# Patient Record
Sex: Male | Born: 1965 | Race: White | Hispanic: No | Marital: Single | Smoking: Never smoker
Health system: Southern US, Community
[De-identification: ages and names within clinical notes are randomized; demographics above are authoritative.]

## PROBLEM LIST (undated history)

## (undated) DIAGNOSIS — E781 Pure hyperglyceridemia: Secondary | ICD-10-CM

## (undated) DIAGNOSIS — I10 Essential (primary) hypertension: Secondary | ICD-10-CM

## (undated) HISTORY — DX: Essential (primary) hypertension: I10

## (undated) HISTORY — DX: Pure hyperglyceridemia: E78.1

## (undated) HISTORY — PX: MENISCUS REPAIR: SHX5179

## (undated) HISTORY — PX: KIDNEY STONE SURGERY: SHX686

---

## 2007-08-12 ENCOUNTER — Ambulatory Visit: Payer: Self-pay | Admitting: Family Medicine

## 2007-08-12 DIAGNOSIS — F419 Anxiety disorder, unspecified: Secondary | ICD-10-CM

## 2007-08-12 DIAGNOSIS — F329 Major depressive disorder, single episode, unspecified: Secondary | ICD-10-CM

## 2007-08-12 LAB — CONVERTED CEMR LAB
ALT: 33 U/L
AST: 25 U/L
Albumin: 4.7 g/dL
Alkaline Phosphatase: 70 U/L
BUN: 17 mg/dL
CO2: 26 meq/L
Calcium: 9.3 mg/dL
Chloride: 104 meq/L
Cholesterol: 144 mg/dL
Creatinine, Ser: 0.83 mg/dL
Glucose, Bld: 92 mg/dL
HDL: 33 mg/dL — ABNORMAL LOW
LDL Cholesterol: 74 mg/dL
Potassium: 4.5 meq/L
Sodium: 141 meq/L
Total Bilirubin: 2.2 mg/dL — ABNORMAL HIGH
Total CHOL/HDL Ratio: 4.4
Total Protein: 7.4 g/dL
Triglycerides: 186 mg/dL — ABNORMAL HIGH
VLDL: 37 mg/dL

## 2007-08-13 ENCOUNTER — Encounter: Payer: Self-pay | Admitting: Family Medicine

## 2010-11-09 ENCOUNTER — Ambulatory Visit (HOSPITAL_COMMUNITY)
Admission: RE | Admit: 2010-11-09 | Discharge: 2010-11-09 | Disposition: A | Discharge status: Home / Self Care | Payer: Self-pay | Source: Ambulatory Visit | Attending: Orthopedic Surgery | Admitting: Orthopedic Surgery

## 2010-11-09 ENCOUNTER — Other Ambulatory Visit (HOSPITAL_COMMUNITY): Payer: Self-pay | Admitting: Orthopedic Surgery

## 2010-11-09 DIAGNOSIS — Z77018 Contact with and (suspected) exposure to other hazardous metals: Secondary | ICD-10-CM

## 2010-11-09 DIAGNOSIS — Z0389 Encounter for observation for other suspected diseases and conditions ruled out: Secondary | ICD-10-CM | POA: Insufficient documentation

## 2010-11-21 ENCOUNTER — Encounter (HOSPITAL_BASED_OUTPATIENT_CLINIC_OR_DEPARTMENT_OTHER)
Admission: RE | Admit: 2010-11-21 | Discharge: 2010-11-21 | Disposition: A | Discharge status: Home / Self Care | Payer: Self-pay | Source: Ambulatory Visit | Attending: Orthopedic Surgery | Admitting: Orthopedic Surgery

## 2010-11-21 DIAGNOSIS — IMO0002 Reserved for concepts with insufficient information to code with codable children: Secondary | ICD-10-CM | POA: Insufficient documentation

## 2010-11-21 DIAGNOSIS — X58XXXA Exposure to other specified factors, initial encounter: Secondary | ICD-10-CM | POA: Insufficient documentation

## 2010-11-21 DIAGNOSIS — Z01810 Encounter for preprocedural cardiovascular examination: Secondary | ICD-10-CM | POA: Insufficient documentation

## 2010-11-23 ENCOUNTER — Ambulatory Visit (HOSPITAL_BASED_OUTPATIENT_CLINIC_OR_DEPARTMENT_OTHER)
Admission: RE | Admit: 2010-11-23 | Discharge: 2010-11-23 | Disposition: A | Discharge status: Home / Self Care | Payer: Self-pay | Source: Ambulatory Visit | Attending: Orthopedic Surgery | Admitting: Orthopedic Surgery

## 2010-11-23 DIAGNOSIS — M2240 Chondromalacia patellae, unspecified knee: Secondary | ICD-10-CM | POA: Insufficient documentation

## 2010-11-23 DIAGNOSIS — Z01810 Encounter for preprocedural cardiovascular examination: Secondary | ICD-10-CM | POA: Insufficient documentation

## 2010-11-23 DIAGNOSIS — Z01812 Encounter for preprocedural laboratory examination: Secondary | ICD-10-CM | POA: Insufficient documentation

## 2010-11-23 DIAGNOSIS — M6740 Ganglion, unspecified site: Secondary | ICD-10-CM | POA: Insufficient documentation

## 2010-11-23 DIAGNOSIS — M23329 Other meniscus derangements, posterior horn of medial meniscus, unspecified knee: Secondary | ICD-10-CM | POA: Insufficient documentation

## 2010-11-26 NOTE — Op Note (Signed)
Benjamin Rios, Benjamin Rios                ACCOUNT NO.:  [id number]  MEDICAL RECORD NO.:  [id number]           PATIENT TYPE:  LOCATION:                                 FACILITY:  PHYSICIAN:  Almedia Balls. Ranell Patrick, M.D. DATE OF BIRTH:  [birth year]  DATE OF PROCEDURE:  11/23/2010 DATE OF DISCHARGE:                              OPERATIVE REPORT   PREOPERATIVE DIAGNOSIS:  Right knee medial meniscus tear as well as ganglion of cruciates.  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  PROCEDURE PERFORMED:  Right knee arthroscopy, partial medial meniscectomy, and decompression of the ganglion of the cruciate.  SURGEON:  Almedia Balls. Ranell Patrick, MD  ASSISTANT:  None.  LMA general anesthesia was used.  Estimated blood loss was minimal.  FLUID REPLACEMENT:  1000 mL crystalloid.  INSTRUMENT COUNTS:  Correct.  COMPLICATIONS:  None.  Perioperative antibiotics were given.  INDICATIONS:  The patient is a 45 year old male with worsening right knee pain.  The patient had preoperative conservative treatment.  He has also had a thorough workup revealing a torn medial meniscus and a ganglion cyst of the cruciates.  He has failed all measures of conservative management and desires operative treatment to restore function and eliminate pain.  Informed consent was obtained.  DESCRIPTION OF THE PROCEDURE:  After an adequate level of anesthesia was achieved, the patient was positioned in the supine position.  The right leg was correctly identified and placed in arthroscopic leg holder with left leg placed in a well-leg holder after sterile prep and drape of the right knee.  We entered the knee using standard portals including superolateral outflow, anterolateral scope, and anteromedial working portals.  We identified pretty significant synovitis present within the knee joint.  There was a deep chondral  fissure in the patella but no significant unstable cartilage, we just documented that patellar fissure.  That did appear to be all the way down to bone but it was only less than a millimeter wide and was not unstable.  We inspected medial and lateral gutters.  No loose bodies were noted, no significant medial plica was noted.  We entered the medial compartment, there was a complex posterior horn medial meniscus tear that was not amenable to repair.  We debrided that using basket forceps and motorized shaver.  We then went ahead and inspected the medial femoral condyle and tibial condyle. Articular cartilage was palpated and visualized and felt to be normal. The ACL was identified.  We went ahead and took our shaver and just pushed that past the ACL between the ACL and PCL posteriorly, immediately got some darker-appearing synovial fluid out of that consistent with a ganglion cyst decompression.  PCL was inspected and felt to be intact.  The lateral compartment was entered, no meniscal tear noted but there was some chondromalacia grade 3 on the femur and little bit on the tibial side.  We did not have to perform a chondroplasty on that, all of the cartilage was stable, just there was some erosion present there on that  side.  We inspected posterior aspect of the knee medially and laterally.  No issues posteriorly and no visible cyst on the back of the knee.  Following this, we did a final inspection of the entire knee.  No loose meniscal debridement pieces were visualized.  We then went and concluded surgery suturing the wound with 4-0 Monocryl followed by Steri-Strips and sterile compressive bandage.  A 0.25% Marcaine with epinephrine was injected into the knee after Steri-Strips applied.  The knee cycled to circulate that fluid around.  Cryotherapy unit placed, and the patient was taken to recovery room indication.     Almedia Balls. Ranell Patrick, M.D.     SRN/MEDQ  D:  11/23/2010  T:   11/24/2010  Job:  [id number]  Electronically Signed by Malon Kindle  on 11/26/2010 07:04:07 PM

## 2012-10-14 IMAGING — CR DG ORBITS FOR FOREIGN BODY
2 series · 2 of 2 positions shown · non-contrast
Comparison: None.

CLINICAL DATA: Welding history.  Pre MRI.

ORBITS FOR FOREIGN BODY - 2 VIEW

[w waters (1 of 2)]
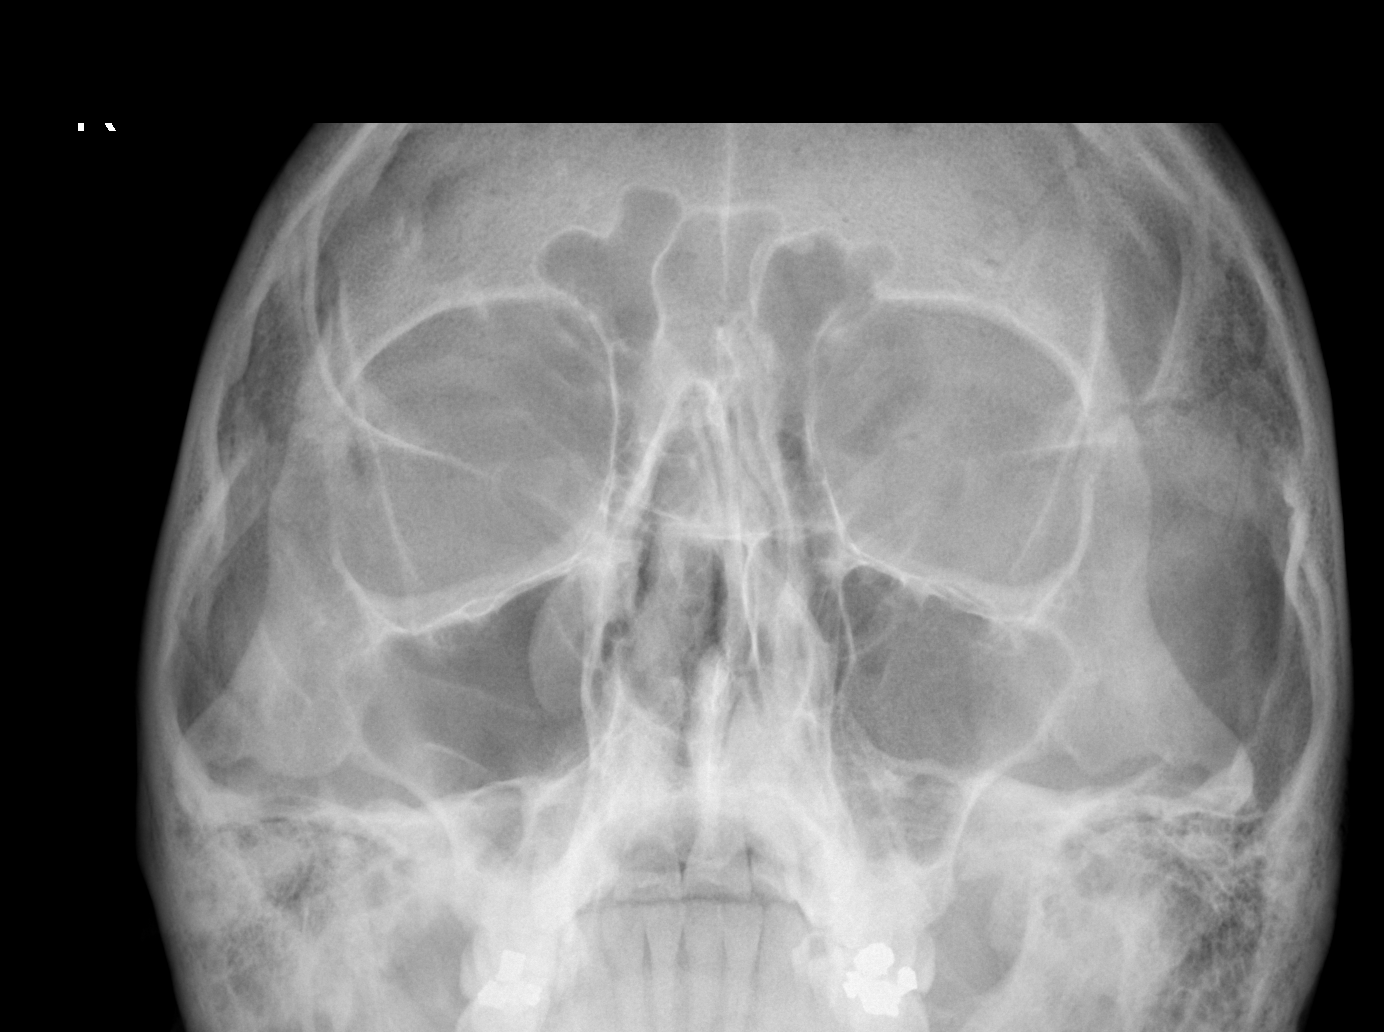

[w waters (2 of 2)]
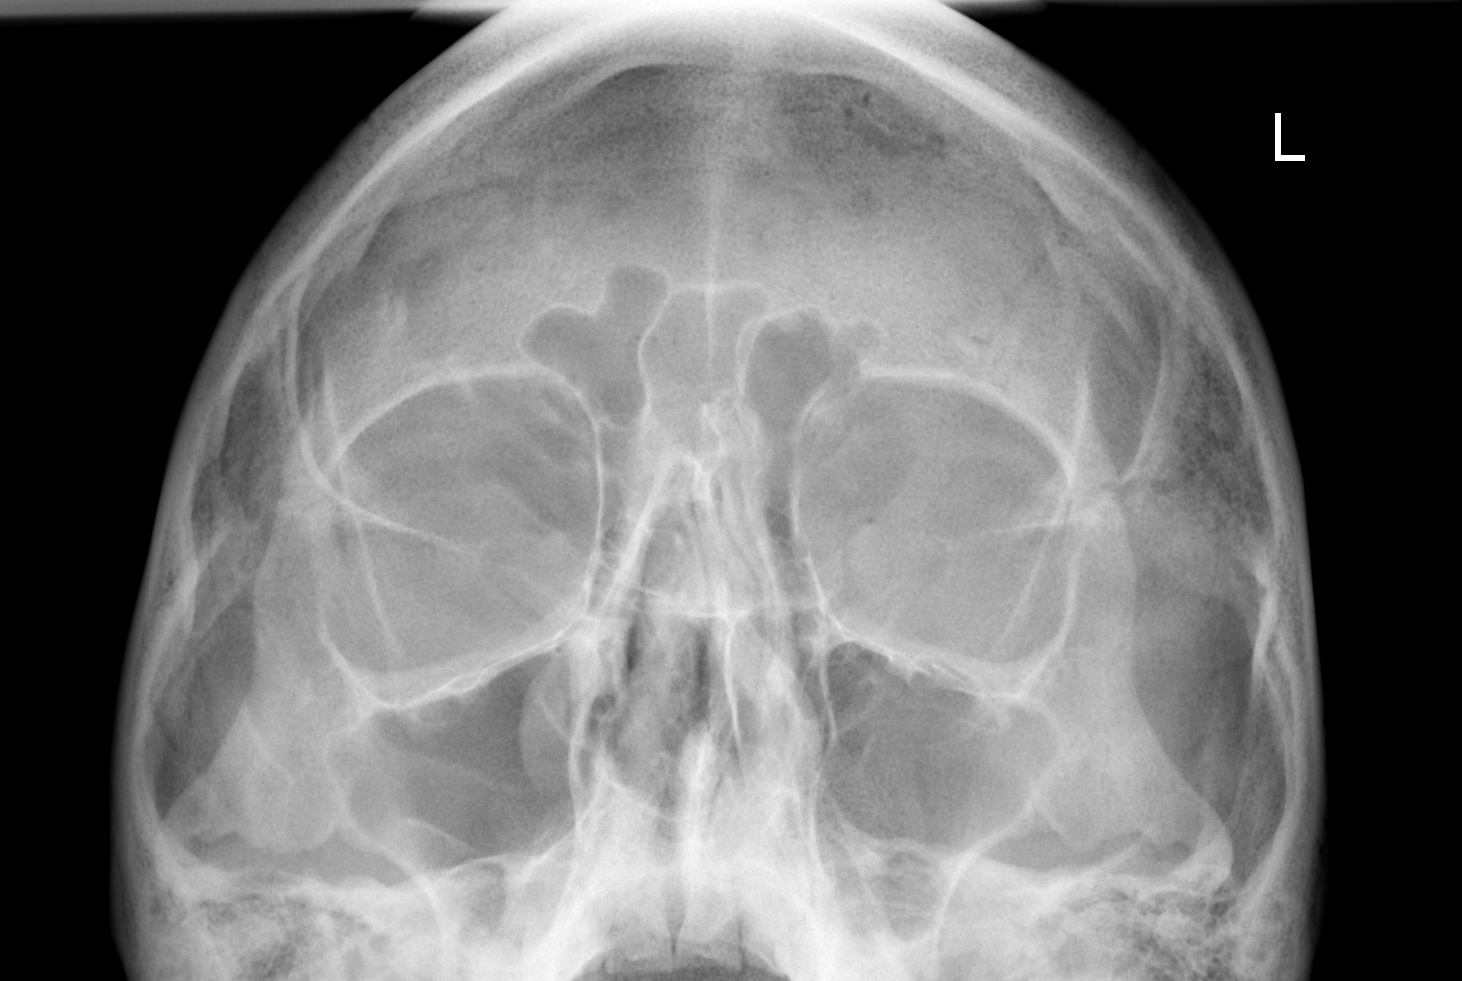

[2 of 2 positions shown; findings below may reference images not displayed]

FINDINGS: No orbital metallic foreign body.
IMPRESSION: No orbital metallic foreign body.

## 2017-10-20 ENCOUNTER — Encounter: Payer: Self-pay | Admitting: Emergency Medicine

## 2017-10-20 ENCOUNTER — Other Ambulatory Visit: Payer: Self-pay

## 2017-10-20 ENCOUNTER — Emergency Department
Admission: EM | Admit: 2017-10-20 | Discharge: 2017-10-20 | Disposition: A | Discharge status: Home / Self Care | Payer: Self-pay | Source: Home / Self Care | Attending: Emergency Medicine | Admitting: Emergency Medicine

## 2017-10-20 DIAGNOSIS — R002 Palpitations: Secondary | ICD-10-CM

## 2017-10-20 DIAGNOSIS — F419 Anxiety disorder, unspecified: Secondary | ICD-10-CM

## 2017-10-20 MED ORDER — ALPRAZOLAM 0.25 MG PO TABS: ORAL_TABLET | ORAL | 0 refills | Status: DC

## 2017-10-20 NOTE — Discharge Instructions (Addendum)
Today, your EKG was normal. Blood pressure 164/80.  The 164 is mildly high but might normalize when anxiety improves.  The 80-the lower number, is within normal limits.  Your physical exam was otherwise within normal limits. Today, I prescribed small amount of short-term anxiety medicine, Xanax, take 1 every 12 hours as needed. It is vitally important that you establish and follow-up with a family doctor within 5 days, for ongoing care and for medical concerns and for preventive care, and you risk severe health problems if you do not.-Please see the pamphlet with names of family doctors.-Your family doctor will need to do blood tests and other evaluation to rule out other medical causes for your symptoms Do not drink alcohol or use marijuana. If you have any severe symptoms, call 911 and go to hospital emergency room immediately

## 2017-10-20 NOTE — ED Triage Notes (Signed)
Pt c/o intermittent chest pain and heart racing over the last few weeks. States he has not seen a doctor in about 20 years.

## 2017-10-20 NOTE — ED Provider Notes (Signed)
[facility]    CSN: [id number] Arrival date & time: 10/20/17  1650 Patient presents to [facility] Saturday, 10/20/17 at 5:00 PM. This is his first visit to our facility   History   Chief Complaint Chief Complaint  Patient presents with  . Hypertension    HPI Benjamin Rios is a 52 y.o. male.  Patient provides a long circuitous history, and the following is a detailed summary of that history: HPI Chief complaint is a feeling of anxiety and occasional fast heartbeat and palpitations, but not currently...and an elevated BP reading today.  He denies actually having chest pain.  No exertional chest pain.  Occasionally feels air hunger at rest and needs to take a deep breath and feels anxious and then when he takes a deep breath, the feeling of air hunger resolves and anxiety improved somewhat.  After further questioning, he states that he has had feelings of anxiety for many many years, but denies depression or hallucinations or suicidal or homicidal ideation. He denies smoking cigarettes but states he is exposed to secondhand smoke. After further questioning, he admits to daily marijuana use "but only a small amount every day".  He states that he just decided to quit smoking marijuana and stopped smoking marijuana 3 days ago.  He states he is having more feelings of anxiety and palpitations since he decided to completely stop smoking marijuana 3 days ago. I questioned him about alcohol usage.  He states that he occasionally drinks alcohol to excess but not daily.  He states last time he drank was "last weekend", about 7 days ago, and he denies any alcohol in the past 7 days. He states he has no PCP, and that the last time he saw a regular doctor was about 15 years ago, and had slightly elevated triglycerides and BP and he did not take any medication and he never followed up with any PCP or other regular doctor since then. He he states that he was feeling a little more  anxious today, he states he went to the fire station today where his diastolic BP was 100, he states was advised by a firefighter to see a doctor ASAP.   History reviewed. No pertinent past medical history. He states he has had some isolated elevated BP readings over the years, but he does not think he has ever been diagnosed with hypertension.  No history of heart disease or chronic lung disease. It was noted in an old chart and EMR, diagnosis of "anxiety state, unspecified" on 08/12/2007 Patient Active Problem List   Diagnosis Date Noted  . ANXIETY STATE, UNSPECIFIED 08/12/2007    History reviewed. No pertinent surgical history.     Home Medications    Prior to Admission medications   Medication Sig Start Date End Date Taking? Authorizing Provider  ALPRAZolam Prudy Feeler) 0.25 MG tablet Take 1 every 12 hours as needed for anxiety or palpitations 10/20/17   Lajean Manes, MD    Family History History reviewed. No pertinent family history. Father had hypertension and COPD.  Mother hypertension Social History Social History   Tobacco Use  . Smoking status: Current Some Day Smoker  . Smokeless tobacco: Never Used  Substance Use Topics  . Alcohol use: Yes  . Drug use: Yes    Types: Marijuana     Allergies   Morphine and related   Review of Systems Review of Systems  Constitutional: Negative for fever.  HENT: Negative.  Negative for congestion and rhinorrhea.  Eyes: Negative for visual disturbance.  Respiratory: Negative for cough, chest tightness, shortness of breath (Denies current shortness of breath) and wheezing.   Cardiovascular: Positive for palpitations. Negative for chest pain and leg swelling.  Gastrointestinal: Negative for abdominal pain, diarrhea, nausea and vomiting.  Genitourinary: Negative.   Musculoskeletal: Negative.   Neurological: Negative for dizziness, tremors, seizures, syncope, speech difficulty, weakness and headaches.  Hematological: Negative for  adenopathy.  Psychiatric/Behavioral: Positive for sleep disturbance. The patient is nervous/anxious.        Denies hallucinations or suicidal or homicidal ideation     Physical Exam Triage Vital Signs ED Triage Vitals [10/20/17 1715]  Enc Vitals Group     BP (!) 164/80     Pulse Rate 70     Resp      Temp 98.6 F (37 C)     Temp Source Oral     SpO2 99 %     Weight 190 lb (86.2 kg)     Height      Head Circumference      Peak Flow      Pain Score 0     Pain Loc      Pain Edu?      Excl. in GC?    No data found.  Updated Vital Signs BP (!) 164/80 (BP Location: Right Arm)   Pulse 70   Temp 98.6 F (37 C) (Oral)   Wt 190 lb (86.2 kg)   SpO2 99%   Visual Acuity Right Eye Distance:   Left Eye Distance:   Bilateral Distance:    Right Eye Near:   Left Eye Near:    Bilateral Near:     Physical Exam  Constitutional: He is oriented to person, place, and time. He appears well-developed and well-nourished. No distress.  HENT:  Head: Normocephalic and atraumatic.  Mouth/Throat: Oropharynx is clear and moist.  Eyes: Pupils are equal, round, and reactive to light. No scleral icterus.  Neck: Normal range of motion. Neck supple. No JVD present. No tracheal deviation present. No thyromegaly present.  Cardiovascular: Normal rate, regular rhythm and normal heart sounds. Exam reveals no gallop and no friction rub.  No murmur heard. Pulmonary/Chest: Effort normal and breath sounds normal.  Abdominal: Soft. Bowel sounds are normal. He exhibits no distension and no mass. There is no tenderness. There is no guarding.  No hepatosplenomegaly  Musculoskeletal: He exhibits no edema.  Lymphadenopathy:    He has no cervical adenopathy.  Neurological: He is alert and oriented to person, place, and time. No cranial nerve deficit or sensory deficit. He exhibits normal muscle tone. Coordination normal.  No tremor.  Gait within normal limits  Skin: Skin is warm and dry. Capillary refill  takes less than 2 seconds. No rash noted. He is not diaphoretic.  Psychiatric: He has a normal mood and affect. His behavior is normal.  Vitals reviewed.    UC Treatments / Results  Labs (all labs ordered are listed, but only abnormal results are displayed) Labs Reviewed - No data to display  EKG None  EKG today within normal limits.  Normal sinus rhythm.  Ventricular rate 64.  PR interval 200.  QRS duration 98.  No ectopy.  No ST or T wave abnormality. Radiology No results found.  Procedures Procedures (including critical care time)  Medications Ordered in UC Medications - No data to display   Initial Impression / Assessment and Plan / UC Course  History of mildly elevated BP and history of  palpitations, however EKG today normal and BP systolic mildly elevated.  BP here 164/80. No evidence of acute cardiorespiratory event. His symptoms are likely anxiety.  It is possible there could be metabolic causes for his symptoms, and that would need to be evaluated by a comprehensive evaluation by his PCP.  Explained that to him and he voiced understanding.  Treatment options discussed, and after risk benefits alternatives discussed, I prescribed Xanax 0.25 mg.  #10, no refills.  1 twice daily for short-term treatment of anxiety symptoms.  He understands that we cannot refill this, and he needs to follow-up with his PCP and any other specialist his PCP decides upon, to further fully evaluate and treat his symptoms. He voiced understanding with above.   I have reviewed the triage vital signs and the nursing notes.  Pertinent labs & imaging results that were available during my care of the patient were reviewed by me and considered in my medical decision making (see chart for details).      An After Visit Summary was printed and given to the patient. "Today, your EKG was normal. Blood pressure 164/80.  The 164 is mildly high but might normalize when anxiety improves.  The 80-the lower  number, is within normal limits.  Your physical exam was otherwise within normal limits. Today, I prescribed small amount of short-term anxiety medicine, Xanax, take 1 every 12 hours as needed. It is vitally important that you establish and follow-up with a family doctor within 5 days, for ongoing care and for medical concerns and for preventive care, and you risk severe health problems if you do not.-Please see the pamphlet with names of family doctors.-Your family doctor will need to do blood tests and other evaluation to rule out other medical causes for your symptoms Do not drink alcohol or use marijuana. If you have any severe symptoms, call 911 and go to hospital emergency room immediately" Final Clinical Impressions(s) / UC Diagnoses   Final diagnoses:  Anxiety  Palpitations    ED Discharge Orders        Ordered    ALPRAZolam (XANAX) 0.25 MG tablet     10/20/17 1751       Controlled Substance Prescriptions [place] Controlled Substance Registry consulted? Yes, I have consulted the [place] Controlled Substances Registry for this patient, and feel the risk/benefit ratio today is favorable for proceeding with this prescription for a controlled substance.   Lajean ManesMassey, David, MD 10/20/17 [phone]

## 2017-10-22 ENCOUNTER — Emergency Department (INDEPENDENT_AMBULATORY_CARE_PROVIDER_SITE_OTHER)
Admission: EM | Admit: 2017-10-22 | Discharge: 2017-10-22 | Disposition: A | Discharge status: Home / Self Care | Payer: Self-pay | Source: Home / Self Care

## 2017-10-22 ENCOUNTER — Telehealth: Payer: Self-pay | Admitting: *Deleted

## 2017-10-22 DIAGNOSIS — I10 Essential (primary) hypertension: Secondary | ICD-10-CM

## 2017-10-22 LAB — POCT URINALYSIS DIP (MANUAL ENTRY)
Bilirubin, UA: NEGATIVE
GLUCOSE UA: NEGATIVE mg/dL
Ketones, POC UA: NEGATIVE mg/dL
Leukocytes, UA: NEGATIVE
Nitrite, UA: NEGATIVE
Protein Ur, POC: NEGATIVE mg/dL
RBC UA: NEGATIVE
SPEC GRAV UA: 1.025 (ref 1.010–1.025)
UROBILINOGEN UA: 0.2 U/dL
pH, UA: 5.5 (ref 5.0–8.0)

## 2017-10-22 NOTE — ED Notes (Signed)
Patient is here for blood draw only. Dr. Cathren HarshBeese ordered the labs, he will f[facility] at [facility] with Gena Fray[facility]ley Cummings. 1 Successful attempt in right AC.

## 2017-10-22 NOTE — Telephone Encounter (Signed)
Patient reports he was told to [facility] at [facility]. He thought he could go today but he cannot be seen today. He would like the labs drawn today. Per Dr. Cathren HarshBeese order CBC, CMP, TSH and UA. No need to see provider today. Patient is set up to potentially see Gena Frayharley Cummings on 10/24/17 1:20pm.

## 2017-10-23 LAB — CBC WITH DIFFERENTIAL/PLATELET
BASOS PCT: 0.5 %
Basophils Absolute: 31 cells/uL (ref 0–200)
EOS ABS: 87 {cells}/uL (ref 15–500)
Eosinophils Relative: 1.4 %
HCT: 48.9 % (ref 38.5–50.0)
HEMOGLOBIN: 17.5 g/dL — AB (ref 13.2–17.1)
Lymphs Abs: 2232 cells/uL (ref 850–3900)
MCH: 31.1 pg (ref 27.0–33.0)
MCHC: 35.8 g/dL (ref 32.0–36.0)
MCV: 86.9 fL (ref 80.0–100.0)
MONOS PCT: 11.3 %
MPV: 11.5 fL (ref 7.5–12.5)
NEUTROS ABS: 3150 {cells}/uL (ref 1500–7800)
Neutrophils Relative %: 50.8 %
Platelets: 191 10*3/uL (ref 140–400)
RBC: 5.63 10*6/uL (ref 4.20–5.80)
RDW: 12.5 % (ref 11.0–15.0)
Total Lymphocyte: 36 %
WBC mixed population: 701 cells/uL (ref 200–950)
WBC: 6.2 10*3/uL (ref 3.8–10.8)

## 2017-10-23 LAB — COMPREHENSIVE METABOLIC PANEL
AG Ratio: 1.8 (calc) (ref 1.0–2.5)
ALBUMIN MSPROF: 4.6 g/dL (ref 3.6–5.1)
ALT: 28 U/L (ref 9–46)
AST: 22 U/L (ref 10–35)
Alkaline phosphatase (APISO): 69 U/L (ref 40–115)
BUN: 24 mg/dL (ref 7–25)
CHLORIDE: 102 mmol/L (ref 98–110)
CO2: 23 mmol/L (ref 20–32)
CREATININE: 0.84 mg/dL (ref 0.70–1.33)
Calcium: 9.4 mg/dL (ref 8.6–10.3)
GLOBULIN: 2.6 g/dL (ref 1.9–3.7)
Glucose, Bld: 63 mg/dL — ABNORMAL LOW (ref 65–99)
SODIUM: 136 mmol/L (ref 135–146)
TOTAL PROTEIN: 7.2 g/dL (ref 6.1–8.1)
Total Bilirubin: 1.8 mg/dL — ABNORMAL HIGH (ref 0.2–1.2)

## 2017-10-23 LAB — SPECIMEN COMPROMISED

## 2017-10-23 LAB — TSH: TSH: 0.79 m[IU]/L (ref 0.40–4.50)

## 2017-10-24 ENCOUNTER — Encounter: Payer: Self-pay | Admitting: Physician Assistant

## 2017-10-24 ENCOUNTER — Ambulatory Visit (INDEPENDENT_AMBULATORY_CARE_PROVIDER_SITE_OTHER): Payer: Self-pay | Admitting: Physician Assistant

## 2017-10-24 VITALS — BP 156/78 | HR 79 | Ht 71.0 in | Wt 193.7 lb

## 2017-10-24 DIAGNOSIS — F1290 Cannabis use, unspecified, uncomplicated: Secondary | ICD-10-CM | POA: Insufficient documentation

## 2017-10-24 DIAGNOSIS — R61 Generalized hyperhidrosis: Secondary | ICD-10-CM

## 2017-10-24 DIAGNOSIS — Z13220 Encounter for screening for lipoid disorders: Secondary | ICD-10-CM

## 2017-10-24 DIAGNOSIS — Z7689 Persons encountering health services in other specified circumstances: Secondary | ICD-10-CM

## 2017-10-24 DIAGNOSIS — R6889 Other general symptoms and signs: Secondary | ICD-10-CM

## 2017-10-24 DIAGNOSIS — R002 Palpitations: Secondary | ICD-10-CM

## 2017-10-24 DIAGNOSIS — I10 Essential (primary) hypertension: Secondary | ICD-10-CM

## 2017-10-24 DIAGNOSIS — Z131 Encounter for screening for diabetes mellitus: Secondary | ICD-10-CM

## 2017-10-24 LAB — CHOLESTEROL, TOTAL: CHOLESTEROL: 160 mg/dL (ref <200)

## 2017-10-24 MED ORDER — ASPIRIN EC 81 MG PO TBEC: 81.0000 mg | DELAYED_RELEASE_TABLET | Freq: Every day | ORAL | 3 refills | Status: AC

## 2017-10-24 NOTE — Patient Instructions (Addendum)
For your blood pressure: - Goal <130/80 - baby aspirin 81 mg daily to help prevent heart attack/stroke - monitor and log blood pressures at home - check around the same time each day in a relaxed setting - Limit salt to <2000 mg/day - Follow DASH eating plan - limit alcohol to 2 standard drinks per day for men and 1 per day for women - avoid tobacco products - weight loss: 7% of current body weight - follow-up at least every 6 months for your blood pressure   For palpitations: - avoid all stimulants including caffeine and nicotine - avoid alcohol   Hypertension Hypertension, commonly called high blood pressure, is when the force of blood pumping through the arteries is too strong. The arteries are the blood vessels that carry blood from the heart throughout the body. Hypertension forces the heart to work harder to pump blood and may cause arteries to become narrow or stiff. Having untreated or uncontrolled hypertension can cause heart attacks, strokes, kidney disease, and other problems. A blood pressure reading consists of a higher number over a lower number. Ideally, your blood pressure should be below 120/80. The first ("top") number is called the systolic pressure. It is a measure of the pressure in your arteries as your heart beats. The second ("bottom") number is called the diastolic pressure. It is a measure of the pressure in your arteries as the heart relaxes. What are the causes? The cause of this condition is not known. What increases the risk? Some risk factors for high blood pressure are under your control. Others are not. Factors you can change  Smoking.  Having type 2 diabetes mellitus, high cholesterol, or both.  Not getting enough exercise or physical activity.  Being overweight.  Having too much fat, sugar, calories, or salt (sodium) in your diet.  Drinking too much alcohol. Factors that are difficult or impossible to change  Having chronic kidney  disease.  Having a family history of high blood pressure.  Age. Risk increases with age.  Race. You may be at higher risk if you are African-American.  Gender. Men are at higher risk than women before age 61. After age 34, women are at higher risk than men.  Having obstructive sleep apnea.  Stress. What are the signs or symptoms? Extremely high blood pressure (hypertensive crisis) may cause:  Headache.  Anxiety.  Shortness of breath.  Nosebleed.  Nausea and vomiting.  Severe chest pain.  Jerky movements you cannot control (seizures).  How is this diagnosed? This condition is diagnosed by measuring your blood pressure while you are seated, with your arm resting on a surface. The cuff of the blood pressure monitor will be placed directly against the skin of your upper arm at the level of your heart. It should be measured at least twice using the same arm. Certain conditions can cause a difference in blood pressure between your right and left arms. Certain factors can cause blood pressure readings to be lower or higher than normal (elevated) for a short period of time:  When your blood pressure is higher when you are in a health care provider's office than when you are at home, this is called white coat hypertension. Most people with this condition do not need medicines.  When your blood pressure is higher at home than when you are in a health care provider's office, this is called masked hypertension. Most people with this condition may need medicines to control blood pressure.  If you have a  high blood pressure reading during one visit or you have normal blood pressure with other risk factors:  You may be asked to return on a different day to have your blood pressure checked again.  You may be asked to monitor your blood pressure at home for 1 week or longer.  If you are diagnosed with hypertension, you may have other blood or imaging tests to help your health care provider  understand your overall risk for other conditions. How is this treated? This condition is treated by making healthy lifestyle changes, such as eating healthy foods, exercising more, and reducing your alcohol intake. Your health care provider may prescribe medicine if lifestyle changes are not enough to get your blood pressure under control, and if:  Your systolic blood pressure is above 130.  Your diastolic blood pressure is above 80.  Your personal target blood pressure may vary depending on your medical conditions, your age, and other factors. Follow these instructions at home: Eating and drinking  Eat a diet that is high in fiber and potassium, and low in sodium, added sugar, and fat. An example eating plan is called the DASH (Dietary Approaches to Stop Hypertension) diet. To eat this way: ? Eat plenty of fresh fruits and vegetables. Try to fill half of your plate at each meal with fruits and vegetables. ? Eat whole grains, such as whole wheat pasta, brown rice, or whole grain bread. Fill about one quarter of your plate with whole grains. ? Eat or drink low-fat dairy products, such as skim milk or low-fat yogurt. ? Avoid fatty cuts of meat, processed or cured meats, and poultry with skin. Fill about one quarter of your plate with lean proteins, such as fish, chicken without skin, beans, eggs, and tofu. ? Avoid premade and processed foods. These tend to be higher in sodium, added sugar, and fat.  Reduce your daily sodium intake. Most people with hypertension should eat less than 1,500 mg of sodium a day.  Limit alcohol intake to no more than 1 drink a day for nonpregnant women and 2 drinks a day for men. One drink equals 12 oz of beer, 5 oz of wine, or 1 oz of hard liquor. Lifestyle  Work with your health care provider to maintain a healthy body weight or to lose weight. Ask what an ideal weight is for you.  Get at least 30 minutes of exercise that causes your heart to beat faster  (aerobic exercise) most days of the week. Activities may include walking, swimming, or biking.  Include exercise to strengthen your muscles (resistance exercise), such as pilates or lifting weights, as part of your weekly exercise routine. Try to do these types of exercises for 30 minutes at least 3 days a week.  Do not use any products that contain nicotine or tobacco, such as cigarettes and e-cigarettes. If you need help quitting, ask your health care provider.  Monitor your blood pressure at home as told by your health care provider.  Keep all follow-up visits as told by your health care provider. This is important. Medicines  Take over-the-counter and prescription medicines only as told by your health care provider. Follow directions carefully. Blood pressure medicines must be taken as prescribed.  Do not skip doses of blood pressure medicine. Doing this puts you at risk for problems and can make the medicine less effective.  Ask your health care provider about side effects or reactions to medicines that you should watch for. Contact a health care provider  if:  You think you are having a reaction to a medicine you are taking.  You have headaches that keep coming back (recurring).  You feel dizzy.  You have swelling in your ankles.  You have trouble with your vision. Get help right away if:  You develop a severe headache or confusion.  You have unusual weakness or numbness.  You feel faint.  You have severe pain in your chest or abdomen.  You vomit repeatedly.  You have trouble breathing. Summary  Hypertension is when the force of blood pumping through your arteries is too strong. If this condition is not controlled, it may put you at risk for serious complications.  Your personal target blood pressure may vary depending on your medical conditions, your age, and other factors. For most people, a normal blood pressure is less than 120/80.  Hypertension is treated with  lifestyle changes, medicines, or a combination of both. Lifestyle changes include weight loss, eating a healthy, low-sodium diet, exercising more, and limiting alcohol. This information is not intended to replace advice given to you by your health care provider. Make sure you discuss any questions you have with your health care provider. Document Released: 06/26/2005 Document Revised: 05/24/2016 Document Reviewed: 05/24/2016 Elsevier Interactive Patient Education  2018 [employer].    Palpitations A palpitation is the feeling that your heartbeat is irregular or is faster than normal. It may feel like your heart is fluttering or skipping a beat. Palpitations are usually not a serious problem. They may be caused by many things, including smoking, caffeine, alcohol, stress, and certain medicines. Although most causes of palpitations are not serious, palpitations can be a sign of a serious medical problem. In some cases, you may need further medical evaluation. Follow these instructions at home: Pay attention to any changes in your symptoms. Take these actions to help with your condition:  Avoid the following: ? Caffeinated coffee, tea, soft drinks, diet pills, and energy drinks. ? Chocolate. ? Alcohol.  Do not use any tobacco products, such as cigarettes, chewing tobacco, and e-cigarettes. If you need help quitting, ask your health care provider.  Try to reduce your stress and anxiety. Things that can help you relax include: ? Yoga. ? Meditation. ? Physical activity, such as swimming, jogging, or walking. ? Biofeedback. This is a method that helps you learn to use your mind to control things in your body, such as your heartbeats.  Get plenty of rest and sleep.  Take over-the-counter and prescription medicines only as told by your health care provider.  Keep all follow-up visits as told by your health care provider. This is important.  Contact a health care provider if:  You continue  to have a fast or irregular heartbeat after 24 hours.  Your palpitations occur more often. Get help right away if:  You have chest pain or shortness of breath.  You have a severe headache.  You feel dizzy or you faint. This information is not intended to replace advice given to you by your health care provider. Make sure you discuss any questions you have with your health care provider. Document Released: 06/23/2000 Document Revised: 11/29/2015 Document Reviewed: 03/11/2015 Elsevier Interactive Patient Education  [employer].

## 2017-10-24 NOTE — Progress Notes (Signed)
HPI:                                                                Benjamin Rios is a 52 y.o. male who presents to [facility] Rios: Primary Care Sports Medicine today to establish care  Current concerns: review lab results, hypertension, sweating  Has been experiencing palpitations and decreased exercise tolerance that he describes as "pounding in my chest" for the last 1-2 months. Occurs multiple days per week lasting minutes. Most recently was provoked by weight lifting. 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 Associated with nausea, lightheadedness and dyspnea. Denies chest pain or abnormal beats. Drinks 1-2 cups of coffee per day. Denies stimulant or drug use. Family hx significant for heart disease in both parents. Denies vision change, headache, chest pain with exertion, orthopnea, lightheadedness, syncope and edema. Risk factors include: male sex, family hx  For the last 2.5 years he has had suffered from diaphoresis "all over" during the day as well as chronic nightsweats. States his skin always feels "clammy."   He was prescribed xanax by urgent care, but it is causing sedation. He does not feel he has an anxiety disorder. He smokes marijuana habitually in the evenings "for sleep" but reports he gave this up last week. Denies symptoms of mania/hypomania. Denies suicidal thinking. Denies auditory/visual hallucinations.  Depression screen PHQ 2/9 10/24/2017  Decreased Interest 2  Down, Depressed, Hopeless 0  PHQ - 2 Score 2  Altered sleeping 3  Tired, decreased energy 2  Change in appetite 0  Feeling bad or failure about yourself  0  Trouble concentrating 0  Moving slowly or fidgety/restless 0  Suicidal thoughts 0  PHQ-9 Score 7  Difficult doing work/chores Somewhat difficult    GAD 7 : Generalized Anxiety Score 10/24/2017  Nervous, Anxious, on Edge 1  Control/stop worrying 1  Worry too much - different things 1  Trouble relaxing  1  Restless 0  Easily annoyed or irritable 2  Afraid - awful might happen 0  Total GAD 7 Score 6  Anxiety Difficulty Somewhat difficult      Past Medical History:  Diagnosis Date  . Hypertension   . Hypertriglyceridemia    Past Surgical History:  Procedure Laterality Date  . KIDNEY STONE SURGERY    . MENISCUS REPAIR     Social History   Tobacco Use  . Smoking status: Never Smoker  . Smokeless tobacco: Never Used  Substance Use Topics  . Alcohol use: Yes   family history includes Atrial fibrillation in his mother; Diabetes in his father; Heart disease in his father; Hyperlipidemia in his father; Hypertension in his father and mother.    ROS: Review of Systems  Constitutional: Positive for diaphoresis.  Cardiovascular: Positive for palpitations.  Gastrointestinal: Positive for heartburn and nausea.  Psychiatric/Behavioral: The patient is nervous/anxious and has insomnia.      Medications: Current Outpatient Medications  Medication Sig Dispense Refill  . ALPRAZolam (XANAX) 0.25 MG tablet Take 1 every 12 hours as needed for anxiety or palpitations 10 tablet 0  . aspirin EC 81 MG tablet Take 1 tablet (81 mg total) by mouth daily. 90 tablet 3   No current facility-administered medications for this visit.    Allergies  Allergen Reactions  . Morphine And Related        Objective:  BP (!) 156/78   Pulse 79   Ht 5\' 11"  (1.803 m)   Wt 193 lb 11.2 oz (87.9 kg)   SpO2 96%   BMI 27.02 kg/m  Gen:  alert, not ill-appearing, no distress, appropriate for age HEENT: head normocephalic without obvious abnormality, conjunctiva and cornea clear, trachea midline Pulm: Normal work of breathing, normal phonation, clear to auscultation bilaterally, no wheezes, rales or rhonchi CV: Normal rate, regular rhythm, s1 and s2 distinct, no murmurs, clicks or rubs; radial pulses 2+ symmetric Neuro: alert and oriented x 3, no tremor MSK: extremities atraumatic, normal gait and  station Skin: warm, diaphoretic, no rashes on exposed skin, no jaundice, no cyanosis Psych: well-groomed, cooperative, good eye contact, appears anxious, speech is articulate, and thought processes clear and goal-directed    Results for orders placed or performed in visit on 10/24/17 (from the past 72 hour(s))  TSH + free T4     Status: None   Collection Time: 10/25/17  8:36 AM  Result Value Ref Range   TSH W/REFLEX TO FT4 1.07 0.40 - 4.50 mIU/L  Lipid Panel w/reflex Direct LDL     Status: Abnormal   Collection Time: 10/25/17  8:36 AM  Result Value Ref Range   Cholesterol 159 <200 mg/dL   HDL 35 (L) >16>40 mg/dL   Triglycerides 109243 (H) <150 mg/dL   LDL Cholesterol (Calc) 90 mg/dL (calc)    Comment: Reference range: <100 . Desirable range <100 mg/dL for primary prevention;   <70 mg/dL for patients with CHD or diabetic patients  with > or = 2 CHD risk factors. Marland Kitchen. LDL-C is now calculated using the Benjamin Rios-Hopkins  calculation, which is a validated novel method providing  better accuracy than the Friedewald equation in the  estimation of LDL-C.  Benjamin PollenMartin SS et al. Lenox AhrJAMA. 6045;409([id number];310(19): 2061-2068  (http://education.[facility].com/faq/FAQ164)    Total CHOL/HDL Ratio 4.5 <5.0 (calc)   Non-HDL Cholesterol (Calc) 124 <130 mg/dL (calc)    Comment: For patients with diabetes plus 1 major ASCVD risk  factor, treating to a non-HDL-C goal of <100 mg/dL  (LDL-C of <19<70 mg/dL) is considered a therapeutic  option.   Hemoglobin A1C w/out eAG     Status: None   Collection Time: 10/25/17  8:36 AM  Result Value Ref Range   Hgb A1c MFr Bld 5.1 <5.7 % of total Hgb    Comment: For the purpose of screening for the presence of diabetes: . <5.7%       Consistent with the absence of diabetes 5.7-6.4%    Consistent with increased risk for diabetes             (prediabetes) > or =6.5%  Consistent with diabetes . This assay result is consistent with a decreased risk of diabetes. . Currently, no  consensus exists regarding use of hemoglobin A1c for diagnosis of diabetes in children. . According to American Diabetes Association (ADA) guidelines, hemoglobin A1c <7.0% represents optimal control in non-pregnant diabetic patients. Different metrics may apply to specific patient populations.  Standards of Medical Care in Diabetes(ADA). .    No results found.    Assessment and Plan: 52 y.o. male with   1. Encounter to establish care - Personally reviewed PMH, PSH, PFH, medications, allergies, HM - Age-appropriate cancer screening: overdue for colon cancer screening, declines today - Influenza n/a - Tdap due - PHQ2=2   2. Uncontrolled hypertension BP Readings from  Last 3 Encounters:  10/24/17 (!) 156/78  10/22/17 (!) 142/83  10/20/17 (!) 164/80  - patient would like to "treat naturally." He is resistant to medication. Discussed risks and benefits of antihypertensive medication. He prefers to try aggressive lifestyle changes. Risks, benefits discussed. Patient to log Bps at home. Recommend baby asa for primary prevention.    3. Heart palpitations, Decreased exercise tolerance - given family hx of heart disease, uncontrolled HTN, and chest pain with exertion, recommend thorough outpatient cardiac work-up to include stress test and cardiac event monitor. Will do stress echo due to the decreased exercise tolerance to assess heart structure and EF - counseled to avoid all stimulants, alcohol and drugs. Avoid strenuous exercise until work-up is complete - CMP, CBC, TSH + free T4 - Comprehensive metabolic panel - ECHOCARDIOGRAM STRESS TEST; Future - Cardiac event monitor; Future  Encounter to establish care  Uncontrolled hypertension - Plan: aspirin EC 81 MG tablet  Heart palpitations - Plan: TSH + free T4, Comprehensive metabolic panel, ECHOCARDIOGRAM STRESS TEST, Cardiac event monitor  Decreased exercise tolerance - Plan: ECHOCARDIOGRAM STRESS TEST  Hyperhidrosis - Plan:  Hemoglobin A1c  Hyperbilirubinemia - Plan: Comprehensive metabolic panel  Screening for lipid disorders - Plan: Lipid Panel w/reflex Direct LDL  Screening for diabetes mellitus - Plan: Hemoglobin A1c    Patient education and anticipatory guidance given Patient agrees with treatment plan Follow-up as needed if symptoms worsen or fail to improve  Levonne Hubert PA-C

## 2017-10-25 ENCOUNTER — Other Ambulatory Visit: Payer: Self-pay | Admitting: Physician Assistant

## 2017-10-25 ENCOUNTER — Telehealth: Payer: Self-pay

## 2017-10-25 ENCOUNTER — Telehealth: Payer: Self-pay | Admitting: Physician Assistant

## 2017-10-25 ENCOUNTER — Encounter: Payer: Self-pay | Admitting: Physician Assistant

## 2017-10-25 LAB — COMPLETE METABOLIC PANEL WITH GFR
AG RATIO: 1.9 (calc) (ref 1.0–2.5)
ALT: 25 U/L (ref 9–46)
AST: 21 U/L (ref 10–35)
Albumin: 4.5 g/dL (ref 3.6–5.1)
Alkaline phosphatase (APISO): 66 U/L (ref 40–115)
BUN: 21 mg/dL (ref 7–25)
CO2: 28 mmol/L (ref 20–32)
CREATININE: 1 mg/dL (ref 0.70–1.33)
Calcium: 9.5 mg/dL (ref 8.6–10.3)
Chloride: 104 mmol/L (ref 98–110)
GFR, Est African American: 98 mL/min/{1.73_m2} (ref >=60)
GFR, Est Non African American: 85 mL/min/{1.73_m2} (ref >=60)
GLOBULIN: 2.4 g/dL (ref 1.9–3.7)
Glucose, Bld: 98 mg/dL (ref 65–99)
Potassium: 4.6 mmol/L (ref 3.5–5.3)
SODIUM: 139 mmol/L (ref 135–146)
TOTAL PROTEIN: 6.9 g/dL (ref 6.1–8.1)
Total Bilirubin: 2.4 mg/dL — ABNORMAL HIGH (ref 0.2–1.2)

## 2017-10-25 NOTE — Telephone Encounter (Signed)
Pt called clinic and stated he wants to know if PCP wanted him to start a BP Rx. If so, he is open to try. He reports a friend mentioned atenolol might help with his BP and anxiety.

## 2017-10-25 NOTE — Telephone Encounter (Signed)
Left VM with lab results and contact information.

## 2017-10-25 NOTE — Telephone Encounter (Signed)
Pt advised. Verbalized understanding.

## 2017-10-25 NOTE — Telephone Encounter (Signed)
I would like to wait for the results of his cardiac event monitor If we beta block him, it will alter the results of the test I would like him to continue with the original plan, which is monitor and record blood pressures at home and outpatient cardiac work-up Once I have this information, then I can start him on the right medication

## 2017-10-26 ENCOUNTER — Encounter: Payer: Self-pay | Admitting: Physician Assistant

## 2017-10-26 DIAGNOSIS — R61 Generalized hyperhidrosis: Secondary | ICD-10-CM | POA: Insufficient documentation

## 2017-10-26 LAB — LIPID PANEL W/REFLEX DIRECT LDL
CHOL/HDL RATIO: 4.5 (calc) (ref <5.0)
CHOLESTEROL: 159 mg/dL (ref <200)
HDL: 35 mg/dL — AB (ref >40)
LDL Cholesterol (Calc): 90 mg/dL (calc)
Non-HDL Cholesterol (Calc): 124 mg/dL (calc) (ref <130)
Triglycerides: 243 mg/dL — ABNORMAL HIGH (ref <150)

## 2017-10-26 LAB — TSH+FREE T4: TSH W/REFLEX TO FT4: 1.07 mIU/L (ref 0.40–4.50)

## 2017-10-26 LAB — HEMOGLOBIN A1C W/OUT EAG: Hgb A1c MFr Bld: 5.1 % of total Hgb (ref <5.7)

## 2017-10-29 NOTE — Progress Notes (Signed)
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 This is mild, does not generally cause symptoms and does not require treatment - no evidence of diabetes or thyroid disease - cholesterol is in a healthy range

## 2017-11-01 ENCOUNTER — Telehealth (HOSPITAL_COMMUNITY): Payer: Self-pay | Admitting: *Deleted

## 2017-11-01 NOTE — Telephone Encounter (Signed)
Patient given detailed instructions per Stress Test Requisition Sheet for test on 11/05/17 at 7:30.Patient Notified to arrive 30 minutes early, and that it is imperative to arrive on time for appointment to keep from having the test rescheduled.  Patient verbalized understanding. Benjamin DolinSharon S Brooks

## 2017-11-05 ENCOUNTER — Ambulatory Visit (HOSPITAL_COMMUNITY): Payer: Self-pay

## 2017-11-05 ENCOUNTER — Ambulatory Visit (HOSPITAL_COMMUNITY): Payer: Self-pay | Attending: Cardiovascular Disease

## 2017-11-05 DIAGNOSIS — R6889 Other general symptoms and signs: Secondary | ICD-10-CM

## 2017-11-05 DIAGNOSIS — E785 Hyperlipidemia, unspecified: Secondary | ICD-10-CM | POA: Insufficient documentation

## 2017-11-05 DIAGNOSIS — R002 Palpitations: Secondary | ICD-10-CM | POA: Insufficient documentation

## 2017-11-05 DIAGNOSIS — I10 Essential (primary) hypertension: Secondary | ICD-10-CM | POA: Insufficient documentation

## 2017-11-06 NOTE — Progress Notes (Signed)
Normal stress test No evidence of heart disease

## 2017-11-07 ENCOUNTER — Telehealth: Payer: Self-pay | Admitting: Physician Assistant

## 2017-11-07 NOTE — Telephone Encounter (Signed)
Called pt and advised him of your advice

## 2017-11-07 NOTE — Telephone Encounter (Signed)
Pt called and lvm stating his stress test came back normal and his bp has been spot on for the past month. He is wanting to know if he still needs to be taking the  of aspirin since the tests come back fine. Thanks

## 2017-11-07 NOTE — Telephone Encounter (Signed)
Even though his blood pressure is well-controlled, he still has hypertension, which is a risk factor for heart disease. Baby aspirin is recommended for men over 50 with cardiovascular risk factors as primary prevention for a heart attack. As with all medications, it is his choice if he would like to do this. But it is a preventive measure, not a treatment for blood pressure.

## 2017-11-12 ENCOUNTER — Ambulatory Visit (INDEPENDENT_AMBULATORY_CARE_PROVIDER_SITE_OTHER): Payer: Self-pay

## 2017-11-12 DIAGNOSIS — R002 Palpitations: Secondary | ICD-10-CM

## 2017-11-21 ENCOUNTER — Encounter: Payer: Self-pay | Admitting: Physician Assistant

## 2017-11-21 ENCOUNTER — Ambulatory Visit (INDEPENDENT_AMBULATORY_CARE_PROVIDER_SITE_OTHER): Payer: Self-pay | Admitting: Physician Assistant

## 2017-11-21 VITALS — BP 125/64 | HR 79 | Wt 188.0 lb

## 2017-11-21 DIAGNOSIS — R0600 Dyspnea, unspecified: Secondary | ICD-10-CM

## 2017-11-21 DIAGNOSIS — F32A Depression, unspecified: Secondary | ICD-10-CM

## 2017-11-21 DIAGNOSIS — G4719 Other hypersomnia: Secondary | ICD-10-CM

## 2017-11-21 DIAGNOSIS — R002 Palpitations: Secondary | ICD-10-CM

## 2017-11-21 DIAGNOSIS — Z9289 Personal history of other medical treatment: Secondary | ICD-10-CM

## 2017-11-21 DIAGNOSIS — R6882 Decreased libido: Secondary | ICD-10-CM

## 2017-11-21 DIAGNOSIS — I10 Essential (primary) hypertension: Secondary | ICD-10-CM

## 2017-11-21 DIAGNOSIS — R5382 Chronic fatigue, unspecified: Secondary | ICD-10-CM

## 2017-11-21 DIAGNOSIS — F329 Major depressive disorder, single episode, unspecified: Secondary | ICD-10-CM

## 2017-11-21 DIAGNOSIS — F419 Anxiety disorder, unspecified: Secondary | ICD-10-CM

## 2017-11-21 NOTE — Progress Notes (Signed)
HPI:                                                                Benjamin Rios is a 52 y.o. male who presents to [name]: Primary Care [facility] Medicine today for hypertension and palpitation follow-up  HTN: managing with diet and exercise. Checks BP's at home. BP range 109-144/69-82, approximately 2/3 of his readings are in range. Has made a lot of healthy changes; he has lost 6 pounds in 1 month, he is exercising most days. Denies vision change, headache, chest pain with exertion, orthopnea, lightheadedness, syncope and edema. Risk factors include: male sex, family hx He had a negative stress echo on 11/05/17 Currently wearing cardiac event monitor. Endorses palpitations with exercise and with stressful situations, this occurs a few days per week.  He is concerned about the possibility of sleep apnea. He states he is a light sleeper and at times is quite restless. There have been a few instances over the years of waking up feeling like he had stopped breathing. Denies witnessed apneas or disurptive snoring. He endorses excessive daytime sleepiness, decreased strength/endurance, depressed mood, falling asleep in the afternoons/evenings, and deterioration in work performance. Reports father has sleep apnea.   Depression screen [facility] 2/9 11/21/2017 10/24/2017  Decreased Interest 2 2  Down, Depressed, Hopeless 2 0  PHQ - 2 Score 4 2  Altered sleeping 3 3  Tired, decreased energy 2 2  Change in appetite 1 0  Feeling bad or failure about yourself  1 0  Trouble concentrating 1 0  Moving slowly or fidgety/restless 1 0  Suicidal thoughts 0 0  PHQ-9 Score 13 7  Difficult doing work/chores - Somewhat difficult    GAD 7 : Generalized Anxiety Score 11/21/2017 10/24/2017  Nervous, Anxious, on Edge 2 1  Control/stop worrying 2 1  Worry too much - different things 3 1  Trouble relaxing 2 1  Restless 1 0  Easily annoyed or irritable 3 2  Afraid - awful might happen 1 0  Total  GAD 7 Score 14 6  Anxiety Difficulty - Somewhat difficult      Past Medical History:  Diagnosis Date  . Hypertension   . Hypertriglyceridemia    Past Surgical History:  Procedure Laterality Date  . KIDNEY STONE SURGERY    . MENISCUS REPAIR     Social History   Tobacco Use  . Smoking status: Never Smoker  . Smokeless tobacco: Never Used  Substance Use Topics  . Alcohol use: Yes   family history includes Atrial fibrillation in his mother; Diabetes in his father; Heart disease in his father; Hyperlipidemia in his father; Hypertension in his father and mother.    ROS: Review of Systems  Constitutional: Positive for malaise/fatigue.  Psychiatric/Behavioral: Positive for depression.    Medications: Current Outpatient Medications  Medication Sig Dispense Refill  . aspirin EC 81 MG tablet Take 1 tablet (81 mg total) by mouth daily. 90 tablet 3   No current facility-administered medications for this visit.    Allergies  Allergen Reactions  . Morphine And Related        Objective:  BP 125/64   Pulse 79   Wt 188 lb (85.3 kg)   BMI 26.22 kg/m  Gen:  alert,  not ill-appearing, no distress, appropriate for age HEENT: head normocephalic without obvious abnormality, conjunctiva and cornea clear, wearing glasses, trachea midline Pulm: Normal work of breathing, normal phonation CV: wearing cardiac event monitor Neuro: alert and oriented x 3, no tremor MSK: extremities atraumatic, normal gait and station Skin: intact, no rashes on exposed skin, no jaundice, no cyanosis Psych: well-groomed, cooperative, good eye contact, euthymic mood, affect mood-congruent, speech is articulate, and thought processes clear and goal-directed    No results found for this or any previous visit (from the past 72 hour(s)). No results found.    Assessment and Plan: 52 y.o. male with   Hypertension goal BP (blood pressure) < 130/80  Gilbert syndrome  Low libido - ADAM Questionnaire  score 8 - Plan: Testosterone  Chronic fatigue - Plan: Testosterone  Excessive daytime sleepiness - Plan: Home sleep test  PND (paroxysmal nocturnal dyspnea) - Plan: Home sleep test  Anxiety and depression  Heart palpitations  History of exercise stress test - 11/05/17 negative  Hypertension BP Readings from Last 3 Encounters:  11/21/17 125/64  10/24/17 (!) 156/78  10/22/17 (!) 142/83  - well-controlled with diet and lifestyle measures - cont asa for primary prevention  Excessive daytime sleepiness - he has a positive ADAM questionairre. Checking fasting testosterone levels. Also recommend a sleep study - STOPBANG score 4  Anxiety and depression PHQ9=13, no acute safety issues GAD7=14, moderately severe Discussed non-medication treatments including CBT, mindfulness, meditation, sleep hygiene, exercise He may benefit from SSRI, but declines medication management at this tiime   Patient education and anticipatory guidance given Patient agrees with treatment plan Follow-up in 2 weeks for palpitations/review cardiac event monitor results or sooner as needed if symptoms worsen or fail to improve  Levonne Hubert PA-C

## 2017-11-21 NOTE — Patient Instructions (Addendum)
Return to the lab for 8 am fasting testosterone level   You will be contacted by [facility] about setting up a home sleep study. You can discuss cost with them   For your blood pressure: - Goal <130/80 - baby aspirin 81 mg daily to help prevent heart attack/stroke - monitor and log blood pressures at home - check around the same time each day in a relaxed setting - Limit salt to <2000 mg/day - Follow DASH eating plan - limit alcohol to 2 standard drinks per day for men and 1 per day for women - avoid tobacco products - weight loss: 7% of current body weight - follow-up every 6 months for your blood pressure   Physical Activity Recommendations for modifying lipids and lowering blood pressure Engage in aerobic physical activity to reduce LDL-cholesterol, non-HDL-cholesterol, and blood pressure  Frequency: 3-4 sessions per week  Intensity: moderate to vigorous  Duration: 40 minutes on average   1. Aerobic exercise  Frequency: 3-5 sessions per week  Intensity: 50-80% capacity  Duration: 20 - 60 minutes  Examples: walking, treadmill, cycling, rowing, stair climbing, and arm/leg ergometry  2. Resistance exercise  Frequency: 2-3 sessions per week  Intensity: 10-15 repetitions/set to moderate fatigue  Duration: 1-3 sets of 8-10 upper and lower body exercises  Examples: calisthenics, elastic bands, cuff/hand weights, dumbbels, free weights, wall pulleys, and weight machines  Heart-Healthy Lifestyle  Eating a diet rich in vegetables, fruits and whole grains: also includes low-fat dairy products, poultry, fish, legumes, and nuts; limit intake of sweets, sugar-sweetened beverages and red meats  Getting regular exercise  Maintaining a healthy weight  Not smoking or getting help quitting  Staying on top of your health; for some people, lifestyle changes alone may not be enough to prevent a heart attack or stroke. In these cases, taking a statin at the right dose will most  likely be necessary  Sleep Studies A sleep study (polysomnogram) is a series of tests done while you are sleeping. It can show how well you sleep. This can help your health care provider diagnose a sleep disorder and show how severe your sleep disorder is. A sleep study may lead to treatment that will help you sleep better and prevent other medical problems caused by poor sleep. If you have a sleep disorder, you may also be at risk for:  Sleep-related accidents.  High blood pressure.  Heart disease.  Stroke.  Other medical conditions.  Sleep disorders are common. Your health care provider may suspect a sleep disorder if you:  Have loud snoring most nights.  Have brief periods when you stop breathing at night.  Feel sleepy on most days.  Fall asleep suddenly during the day.  Have trouble falling asleep or staying asleep.  Feel like you need to move your legs when trying to fall asleep.  Have dreams that seem very real shortly after falling asleep.  Feel like you cannot move when you first wake up.  Which tests will I need to have? Most sleep studies last all night and include these tests:  Recordings of your brain activity.  Recordings of your eye movements.  Recording of your heart rate and rhythm.  Blood pressure readings.  Readings of the amount of oxygen in your blood.  Measurements of your chest and belly movement as you breathe during sleep.  If you have signs of the sleep disorder called sleep apnea during your test, you may get a mask to wear for the second half of  the night.  The mask provides continuous positive airway pressure (CPAP). This may improve sleep apnea significantly.  You will then have all tests done again with the mask in place to see if your measurements and recordings change.  How are sleep studies done? Most sleep studies are done over one full night of sleep.  You will arrive at the study center in the evening and can go home in the  morning.  Bring your pajamas and toothbrush.  Do not have caffeine on the day of your sleep study.  Your health care provider will let you know if you need to stop taking any of your regular medicines before the test.  To do the tests included in a polysomnogram, you will have:  Round, sticky patches with sensors attached to recording wires (electrodes) placed on your scalp, face, chest, and limbs.  Wires from all the electrodes and sensors run from your bed to a computer. The wires can be taken off and put back on if you need to get out of bed to go to the bathroom.  A sensor placed over your nose to measure airflow.  A finger clip put on one finger to measure your blood oxygen level.  A belt around your belly and a belt around your chest to measure breathing movements.  Where are sleep studies done? Sleep studies are done at sleep centers. A sleep center may be inside a hospital, office, or clinic. The room where you have the study may look like a hospital room or a hotel room. The health care providers doing the study may come in and out of the room during the study. Most of the time, they will be in another room monitoring your test. How is information from sleep studies helpful? A polysomnogram can be used along with your medical history and a physical exam to diagnose conditions, such as:  Sleep apnea.  Restless legs syndrome.  Sleep-related seizure disorders.  Sleep-related movement disorders.  A medical doctor who specializes in sleep will evaluate your sleep study. The specialist will share the results with your primary health care provider. Treatments based on your sleep study may include:  Improving your sleep habits (sleep hygiene).  Wearing a CPAP mask.  Wearing an oral device at night to improve breathing and reduce snoring.  Taking medicine for: ? Restless legs syndrome. ? Sleep-related seizure disorder. ? Sleep-related movement disorder.  This  information is not intended to replace advice given to you by your health care provider. Make sure you discuss any questions you have with your health care provider. Document Released: 12/31/2002 Document Revised: 02/20/2016 Document Reviewed: 09/01/2013 Elsevier Interactive Patient Education  [employer].

## 2017-11-23 LAB — TESTOSTERONE: Testosterone: 370 ng/dL (ref 250–827)

## 2017-11-23 NOTE — Progress Notes (Signed)
Testosterone level is normal. This is not the cause of his symptoms Recommend proceeding with sleep study

## 2017-12-10 ENCOUNTER — Telehealth: Payer: Self-pay | Admitting: *Deleted

## 2017-12-10 NOTE — Telephone Encounter (Signed)
Called to inform patient his last day of service on his cardiac event monitor will be 12/13/2017.  Patient instructed to ship monitor back directly to [facility] in prepaid packaging enclosed in kit.  Patient was appreciative of call. 

## 2017-12-17 NOTE — Progress Notes (Signed)
Cardiac event monitor did not show any heart arrhythmia During symptomatic episodes, patient's heart rate was normal or a little elevated at times Option to do a low-dose beta blocker for symptomatic management if patient would like

## 2019-05-12 ENCOUNTER — Other Ambulatory Visit: Payer: Self-pay

## 2019-05-12 ENCOUNTER — Encounter: Payer: Self-pay | Admitting: Emergency Medicine

## 2019-05-12 ENCOUNTER — Emergency Department
Admission: EM | Admit: 2019-05-12 | Discharge: 2019-05-12 | Disposition: A | Discharge status: Home / Self Care | Payer: Self-pay | Source: Home / Self Care | Attending: Family Medicine | Admitting: Family Medicine

## 2019-05-12 ENCOUNTER — Telehealth: Payer: Self-pay

## 2019-05-12 DIAGNOSIS — R1032 Left lower quadrant pain: Secondary | ICD-10-CM

## 2019-05-12 LAB — POCT CBC W AUTO DIFF (K'VILLE URGENT CARE)

## 2019-05-12 LAB — POCT URINALYSIS DIP (MANUAL ENTRY)
Bilirubin, UA: NEGATIVE
Blood, UA: NEGATIVE
Glucose, UA: NEGATIVE mg/dL
Leukocytes, UA: NEGATIVE
Nitrite, UA: NEGATIVE
Protein Ur, POC: NEGATIVE mg/dL
Spec Grav, UA: >=1.03 — AB (ref 1.010–1.025)
Urobilinogen, UA: 0.2 E.U./dL
pH, UA: 5.5 (ref 5.0–8.0)

## 2019-05-12 NOTE — Telephone Encounter (Signed)
Pt called and asked if he should be on antibiotics if he might have diverticulitis.  Could you please advise.

## 2019-05-12 NOTE — ED Provider Notes (Signed)
[facility]    CSN: [id number] Arrival date & time: 05/12/19  2035      History   Chief Complaint Chief Complaint  Patient presents with  . Abdominal Pain    HPI Benjamin Rios is a 53 y.o. male.   Three days ago patient awoke with left abdomen and flank pain that has persisted.  That evening he had fever to 99 for one night only.  His left flank/abdominal pain was tender to touch and worse with bending and twisting.  It was also worse when he was supine on his right side.  He denies nausea/vomiting, changes in bowel movements, and urinary symptoms.  He reports that his pain has been gradually improving but is still present. He has a past history of passing 6 kidney stones (last time approximately age 85).  He had a negative colonoscopy age 62. Family history of colon CA in maternal grandmother.  The history is provided by the patient.  Abdominal Pain Pain location:  LLQ and L flank Pain quality: aching and cramping   Pain radiates to:  Does not radiate Pain severity:  Moderate Onset quality:  Sudden Duration:  3 days Timing:  Constant Progression:  Improving Chronicity:  New Context: not diet changes, not eating, not previous surgeries, not recent illness, not recent travel, not sick contacts, not suspicious food intake and not trauma   Relieved by:  Nothing Worsened by:  Movement Ineffective treatments:  None tried Associated symptoms: fever   Associated symptoms: no anorexia, no belching, no chest pain, no chills, no constipation, no cough, no diarrhea, no dysuria, no fatigue, no flatus, no hematemesis, no hematochezia, no hematuria, no melena, no nausea, no shortness of breath, no sore throat and no vomiting   Risk factors: no alcohol abuse, has not had multiple surgeries and no NSAID use     Past Medical History:  Diagnosis Date  . Hypertension   . Hypertriglyceridemia     Patient Active Problem List   Diagnosis Date Noted  . Gilbert syndrome  11/21/2017  . Chronic fatigue 11/21/2017  . Low libido 11/21/2017  . Excessive daytime sleepiness 11/21/2017  . PND (paroxysmal nocturnal dyspnea) 11/21/2017  . History of exercise stress test 11/21/2017  . Hyperhidrosis 10/26/2017  . Hypertension goal BP (blood pressure) < 130/80 10/24/2017  . Heart palpitations 10/24/2017  . Decreased exercise tolerance 10/24/2017  . Hyperbilirubinemia 10/24/2017  . Marijuana use, continuous 10/24/2017  . Anxiety and depression 08/12/2007    Past Surgical History:  Procedure Laterality Date  . KIDNEY STONE SURGERY    . MENISCUS REPAIR         Home Medications    Prior to Admission medications   Medication Sig Start Date End Date Taking? Authorizing Provider  aspirin EC 81 MG tablet Take 1 tablet (81 mg total) by mouth daily. 10/24/17   Trixie Dredge, PA-C    Family History Family History  Problem Relation Age of Onset  . Hypertension Mother   . Atrial fibrillation Mother   . Heart disease Father   . Hypertension Father   . Diabetes Father   . Hyperlipidemia Father     Social History Social History   Tobacco Use  . Smoking status: Never Smoker  . Smokeless tobacco: Never Used  Substance Use Topics  . Alcohol use: Yes  . Drug use: Yes    Types: Marijuana     Allergies   Morphine and related   Review of Systems Review of Systems  Constitutional: Positive for fever. Negative for activity change, appetite change, chills, diaphoresis and fatigue.  HENT: Negative.  Negative for sore throat.   Eyes: Negative.   Respiratory: Negative for cough, chest tightness and shortness of breath.   Cardiovascular: Negative for chest pain.  Gastrointestinal: Positive for abdominal pain. Negative for anorexia, constipation, diarrhea, flatus, hematemesis, hematochezia, melena, nausea and vomiting.  Genitourinary: Positive for flank pain. Negative for decreased urine volume, discharge, dysuria, frequency, hematuria, scrotal  swelling, testicular pain and urgency.  Skin: Negative.   Neurological: Negative.      Physical Exam Triage Vital Signs ED Triage Vitals  Enc Vitals Group     BP 05/12/19 1022 (!) 138/94     Pulse Rate 05/12/19 1022 63     Resp --      Temp 05/12/19 1022 97.8 F (36.6 C)     Temp Source 05/12/19 1022 Oral     SpO2 05/12/19 1022 97 %     Weight 05/12/19 1019 194 lb (88 kg)     Height --      Head Circumference --      Peak Flow --      Pain Score 05/12/19 1020 4     Pain Loc --      Pain Edu? --      Excl. in GC? --    No data found.  Updated Vital Signs BP (!) 138/94 (BP Location: Right Arm)   Pulse 63   Temp 97.8 F (36.6 C) (Oral)   Wt 88 kg   SpO2 97%   BMI 27.06 kg/m   Visual Acuity Right Eye Distance:   Left Eye Distance:   Bilateral Distance:    Right Eye Near:   Left Eye Near:    Bilateral Near:     Physical Exam Vitals signs and nursing note reviewed.  Constitutional:      General: He is not in acute distress.    Appearance: He is normal weight.  HENT:     Head: Normocephalic.     Right Ear: External ear normal.     Left Ear: External ear normal.     Nose: Nose normal.     Mouth/Throat:     Mouth: Mucous membranes are moist.  Eyes:     Pupils: Pupils are equal, round, and reactive to light.  Cardiovascular:     Heart sounds: Normal heart sounds.  Pulmonary:     Breath sounds: Normal breath sounds.  Abdominal:     General: Abdomen is flat. Bowel sounds are normal. There is no distension.     Palpations: Abdomen is soft. There is no hepatomegaly, splenomegaly or mass.     Tenderness: There is abdominal tenderness in the left lower quadrant. There is guarding. There is no left CVA tenderness or rebound. Negative signs include Murphy's sign, McBurney's sign and obturator sign.     Hernia: No hernia is present.    Musculoskeletal:     Right lower leg: No edema.     Left lower leg: No edema.  Lymphadenopathy:     Cervical: No cervical  adenopathy.  Skin:    General: Skin is warm and dry.     Findings: No rash.  Neurological:     Mental Status: He is alert.      UC Treatments / Results  Labs (all labs ordered are listed, but only abnormal results are displayed) Labs Reviewed  POCT URINALYSIS DIP (MANUAL ENTRY) - Abnormal; Notable for the following components:  Result Value   Ketones, POC UA small (15) (*)    Spec Grav, UA >=1.030 (*)    All other components within normal limits  COMPLETE METABOLIC PANEL WITH GFR  AMYLASE  LIPASE  POCT CBC W AUTO DIFF ([facility]):  WBC 6.3; LY 26.9; MO 8.7; GR 64.4; Hgb 16.9; Platelets 177     EKG   Radiology No results found.  Procedures Procedures (including critical care time)  Medications Ordered in UC Medications - No data to display  Initial Impression / Assessment and Plan / UC Course  I have reviewed the triage vital signs and the nursing notes.  Pertinent labs & imaging results that were available during my care of the patient were reviewed by me and considered in my medical decision making (see chart for details).    Normal WBC and urinalysis reassuring.  No evidence urolithiasis. ?resolving diverticulitis.  Because of initial fever (resolved) and left lower quadrant abdominal pain improving, will treat patient conservatively with clear liquid diet initially followed by gradual advance in diet. CMP, amylase, lipase pending. Followup with Family Doctor if not improved in one week.    Advised to call if rash develops (would begin Valtrex).  Final Clinical Impressions(s) / UC Diagnoses   Final diagnoses:  Abdominal pain, left lower quadrant     Discharge Instructions     Begin clear liquids for 24 hours, then advance to a BRAT diet (Bananas, Rice, Applesauce, Toast) for 24 hours. Then gradually resume a regular diet when tolerated.   .  If symptoms become significantly worse during the night or over the weekend, proceed to the local  emergency room.     ED Prescriptions    None     -   Lattie Haw, MD 05/12/19 1735

## 2019-05-12 NOTE — Telephone Encounter (Signed)
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

## 2019-05-12 NOTE — ED Triage Notes (Signed)
Pt states Friday he woke up with abdominal pain on the left side only. States normal BM.Marland Kitchenpain is worse with movement. Tender to touch. No N+V, afebrile. Denies any abdominal surgeries in past. No urinary issues.

## 2019-05-12 NOTE — Discharge Instructions (Addendum)
Begin clear liquids for 24 hours, then advance to a [employer] (Bananas, Rice, Applesauce, Toast) for 24 hours. Then gradually resume a regular diet when tolerated.   .  If symptoms become significantly worse during the night or over the weekend, proceed to the local emergency room.

## 2019-05-13 LAB — COMPLETE METABOLIC PANEL WITH GFR
AG Ratio: 1.7 (calc) (ref 1.0–2.5)
ALT: 50 U/L — ABNORMAL HIGH (ref 9–46)
AST: 29 U/L (ref 10–35)
Albumin: 4.5 g/dL (ref 3.6–5.1)
Alkaline phosphatase (APISO): 83 U/L (ref 35–144)
BUN: 21 mg/dL (ref 7–25)
CO2: 23 mmol/L (ref 20–32)
Calcium: 9.5 mg/dL (ref 8.6–10.3)
Chloride: 103 mmol/L (ref 98–110)
Creat: 0.87 mg/dL (ref 0.70–1.33)
GFR, Est African American: 113 mL/min/{1.73_m2} (ref >=60)
GFR, Est Non African American: 97 mL/min/{1.73_m2} (ref >=60)
Globulin: 2.7 g/dL (calc) (ref 1.9–3.7)
Glucose, Bld: 85 mg/dL (ref 65–99)
Potassium: 4.1 mmol/L (ref 3.5–5.3)
Sodium: 138 mmol/L (ref 135–146)
Total Bilirubin: 2.3 mg/dL — ABNORMAL HIGH (ref 0.2–1.2)
Total Protein: 7.2 g/dL (ref 6.1–8.1)

## 2019-05-13 LAB — AMYLASE: Amylase: 32 U/L (ref 21–101)

## 2019-05-13 LAB — LIPASE: Lipase: 15 U/L (ref 7–60)

## 2019-05-15 ENCOUNTER — Telehealth: Payer: Self-pay | Admitting: Emergency Medicine

## 2019-05-15 NOTE — Telephone Encounter (Signed)
Discussed lab results with patient, all normal except for chronic elevated bilirubin and milidly elevated liver enzyme. Pt encouraged to follow up with PCP for recheck of labs. All questions answered.

## 2021-07-18 ENCOUNTER — Emergency Department
Admission: EM | Admit: 2021-07-18 | Discharge: 2021-07-18 | Disposition: A | Discharge status: Home / Self Care | Payer: Self-pay | Source: Home / Self Care

## 2021-07-18 ENCOUNTER — Other Ambulatory Visit: Payer: Self-pay

## 2021-07-18 DIAGNOSIS — U071 COVID-19: Secondary | ICD-10-CM

## 2021-07-18 LAB — POC SARS CORONAVIRUS 2 AG -  ED: SARS Coronavirus 2 Ag: POSITIVE — AB

## 2021-07-18 LAB — POCT INFLUENZA A/B
Influenza A, POC: NEGATIVE
Influenza B, POC: NEGATIVE

## 2021-07-18 MED ORDER — ONDANSETRON 4 MG PO TBDP
4.0000 mg | ORAL_TABLET | Freq: Once | ORAL | Status: AC
  Administered 2021-07-18: 4 mg via ORAL

## 2021-07-18 MED ORDER — MOLNUPIRAVIR EUA 200MG CAPSULE: 4.0000 | ORAL_CAPSULE | Freq: Two times a day (BID) | ORAL | 0 refills | Status: AC

## 2021-07-18 NOTE — ED Provider Notes (Signed)
[facility]    CSN: [id number] Arrival date & time: 07/18/21  1049      History   Chief Complaint Chief Complaint  Patient presents with   Cough    Cough, fever, body aches, and sore throat. X3 days    HPI Benjamin Rios is a 56 y.o. male.   HPI 56 year old male presents with cough, fever, body aches, sore throat for 3 days.  PMH significant for HTN, chronic fatigue, and Gilbert syndrome.  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   Chronic fatigue 11/21/2017   Low libido 11/21/2017   Excessive daytime sleepiness 11/21/2017   PND (paroxysmal nocturnal dyspnea) 11/21/2017   History of exercise stress test 11/21/2017   Hyperhidrosis 10/26/2017   Hypertension goal BP (blood pressure) < 130/80 10/24/2017   Heart palpitations 10/24/2017   Decreased exercise tolerance 10/24/2017   Hyperbilirubinemia 10/24/2017   Marijuana use, continuous 10/24/2017   Anxiety and depression 08/12/2007    Past Surgical History:  Procedure Laterality Date   KIDNEY STONE SURGERY     MENISCUS REPAIR         Home Medications    Prior to Admission medications   Medication Sig Start Date End Date Taking? Authorizing Provider  Acetaminophen (TYLENOL 8 HOUR PO) Take by mouth.   Yes [provider]  molnupiravir EUA (LAGEVRIO) 200 mg CAPS capsule Take 4 capsules (800 mg total) by mouth 2 (two) times daily for 5 days. 07/18/21 07/23/21 Yes Trevor Iha, FNP  aspirin EC 81 MG tablet Take 1 tablet (81 mg total) by mouth daily. 10/24/17   Carlis Stable, PA-C    Family History Family History  Problem Relation Age of Onset   Hypertension Mother    Atrial fibrillation Mother    Heart disease Father    Hypertension Father    Diabetes Father    Hyperlipidemia Father     Social History Social History   Tobacco Use   Smoking status:  Never   Smokeless tobacco: Never  Vaping Use   Vaping Use: Never used  Substance Use Topics   Alcohol use: Yes   Drug use: Yes    Types: Marijuana     Allergies   Morphine and related   Review of Systems Review of Systems  Constitutional:  Positive for fever.  Respiratory:  Positive for cough.   Musculoskeletal:  Positive for myalgias.  All other systems reviewed and are negative.   Physical Exam Triage Vital Signs ED Triage Vitals  Enc Vitals Group     BP 07/18/21 1112 101/71     Pulse Rate 07/18/21 1112 85     Resp 07/18/21 1112 16     Temp 07/18/21 1112 98.5 F (36.9 C)     Temp Source 07/18/21 1112 Oral     SpO2 07/18/21 1112 97 %     Weight 07/18/21 1114 180 lb (81.6 kg)     Height 07/18/21 1114 5\' 11"  (1.803 m)     Head Circumference --      Peak Flow --      Pain Score 07/18/21 1114 8     Pain Loc --      Pain Edu? --      Excl. in GC? --    No data found.  Updated Vital Signs BP 101/71 (BP Location: Left Arm)  Pulse 85    Temp 98.5 F (36.9 C) (Oral)    Resp 16    Ht 5\' 11"  (1.803 m)    Wt 180 lb (81.6 kg)    SpO2 97%    BMI 25.10 kg/m      Physical Exam Vitals and nursing note reviewed.  Constitutional:      General: He is not in acute distress.    Appearance: Normal appearance. He is normal weight. He is not ill-appearing.  HENT:     Head: Normocephalic and atraumatic.     Right Ear: Tympanic membrane, ear canal and external ear normal.     Left Ear: Tympanic membrane, ear canal and external ear normal.     Mouth/Throat:     Mouth: Mucous membranes are moist.     Pharynx: Oropharynx is clear.  Eyes:     Extraocular Movements: Extraocular movements intact.     Conjunctiva/sclera: Conjunctivae normal.     Pupils: Pupils are equal, round, and reactive to light.  Cardiovascular:     Rate and Rhythm: Normal rate and regular rhythm.     Pulses: Normal pulses.     Heart sounds: Normal heart sounds.  Pulmonary:     Effort: Pulmonary effort  is normal.     Breath sounds: Normal breath sounds.  Musculoskeletal:     Cervical back: Normal range of motion and neck supple.  Skin:    General: Skin is warm and dry.  Neurological:     General: No focal deficit present.     Mental Status: He is alert and oriented to person, place, and time.     UC Treatments / Results  Labs (all labs ordered are listed, but only abnormal results are displayed) Labs Reviewed - No data to display  EKG   Radiology No results found.  Procedures Procedures (including critical care time)  Medications Ordered in UC Medications  ondansetron (ZOFRAN-ODT) disintegrating tablet 4 mg (4 mg Oral Given 07/18/21 1113)    Initial Impression / Assessment and Plan / UC Course  I have reviewed the triage vital signs and the nursing notes.  Pertinent labs & imaging results that were available during my care of the patient were reviewed by me and considered in my medical decision making (see chart for details).     MDM: 1.  COVID-19-Rx'd Molnupiravir-Advised patient to self quarantine for the next 10 days or until 07/29/2021.  Advised patient if asymptomatic/afebrile may discontinue self quarantine after 5 days or 07/24/2021.  Advised patient to take medication as directed with food to completion.  Encouraged patient to increase daily water intake while taking this medication.  Patient discharged home, hemodynamically stable. Final Clinical Impressions(s) / UC Diagnoses   Final diagnoses:  COVID-19     Discharge Instructions      Advised patient to self quarantine for the next 10 days or until 07/29/2021.  Advised patient if asymptomatic/afebrile may discontinue self quarantine after 5 days or 07/24/2021.  Advised patient to take medication as directed with food to completion.  Encouraged patient to increase daily water intake while taking this medication.     ED Prescriptions     Medication Sig Dispense Auth. Provider   molnupiravir EUA (LAGEVRIO)  200 mg CAPS capsule Take 4 capsules (800 mg total) by mouth 2 (two) times daily for 5 days. 40 capsule 07/26/2021, FNP      PDMP not reviewed this encounter.   Trevor Iha, FNP 07/18/21 1259

## 2021-07-18 NOTE — ED Triage Notes (Signed)
Pt states that he has a cough, fever, body aches, and sore throat. X3 days   Pt states that he isn't vaccinated for covid. Pt states that he hasn't had flu vaccine.

## 2021-07-18 NOTE — Discharge Instructions (Addendum)
Advised patient to self quarantine for the next 10 days or until 07/29/2021.  Advised patient if asymptomatic/afebrile may discontinue self quarantine after 5 days or 07/24/2021.  Advised patient to take medication as directed with food to completion.  Encouraged patient to increase daily water intake while taking this medication.
